# Patient Record
Sex: Male | Born: 1960 | Race: White | Hispanic: No | Marital: Married | Smoking: Current every day smoker
Health system: Southern US, Community
[De-identification: ages and names within clinical notes are randomized; demographics above are authoritative.]

---

## 2006-12-29 ENCOUNTER — Ambulatory Visit: Payer: Self-pay | Admitting: Vascular Surgery

## 2007-01-23 ENCOUNTER — Ambulatory Visit: Payer: Self-pay | Admitting: Vascular Surgery

## 2007-01-23 ENCOUNTER — Ambulatory Visit (HOSPITAL_COMMUNITY): Admission: RE | Admit: 2007-01-23 | Discharge: 2007-01-23 | Payer: Self-pay | Admitting: Vascular Surgery

## 2007-02-27 ENCOUNTER — Ambulatory Visit: Payer: Self-pay | Admitting: Vascular Surgery

## 2008-03-27 IMAGING — CR DG CHEST 2V
2 series · 2 of 2 positions shown · non-contrast
Comparison: none

CLINICAL DATA: Peripheral vascular disease and lower extremity claudication.  Preop respiratory exam.
 CHEST ? 2 VIEW:

[view not recorded (1 of 2)]
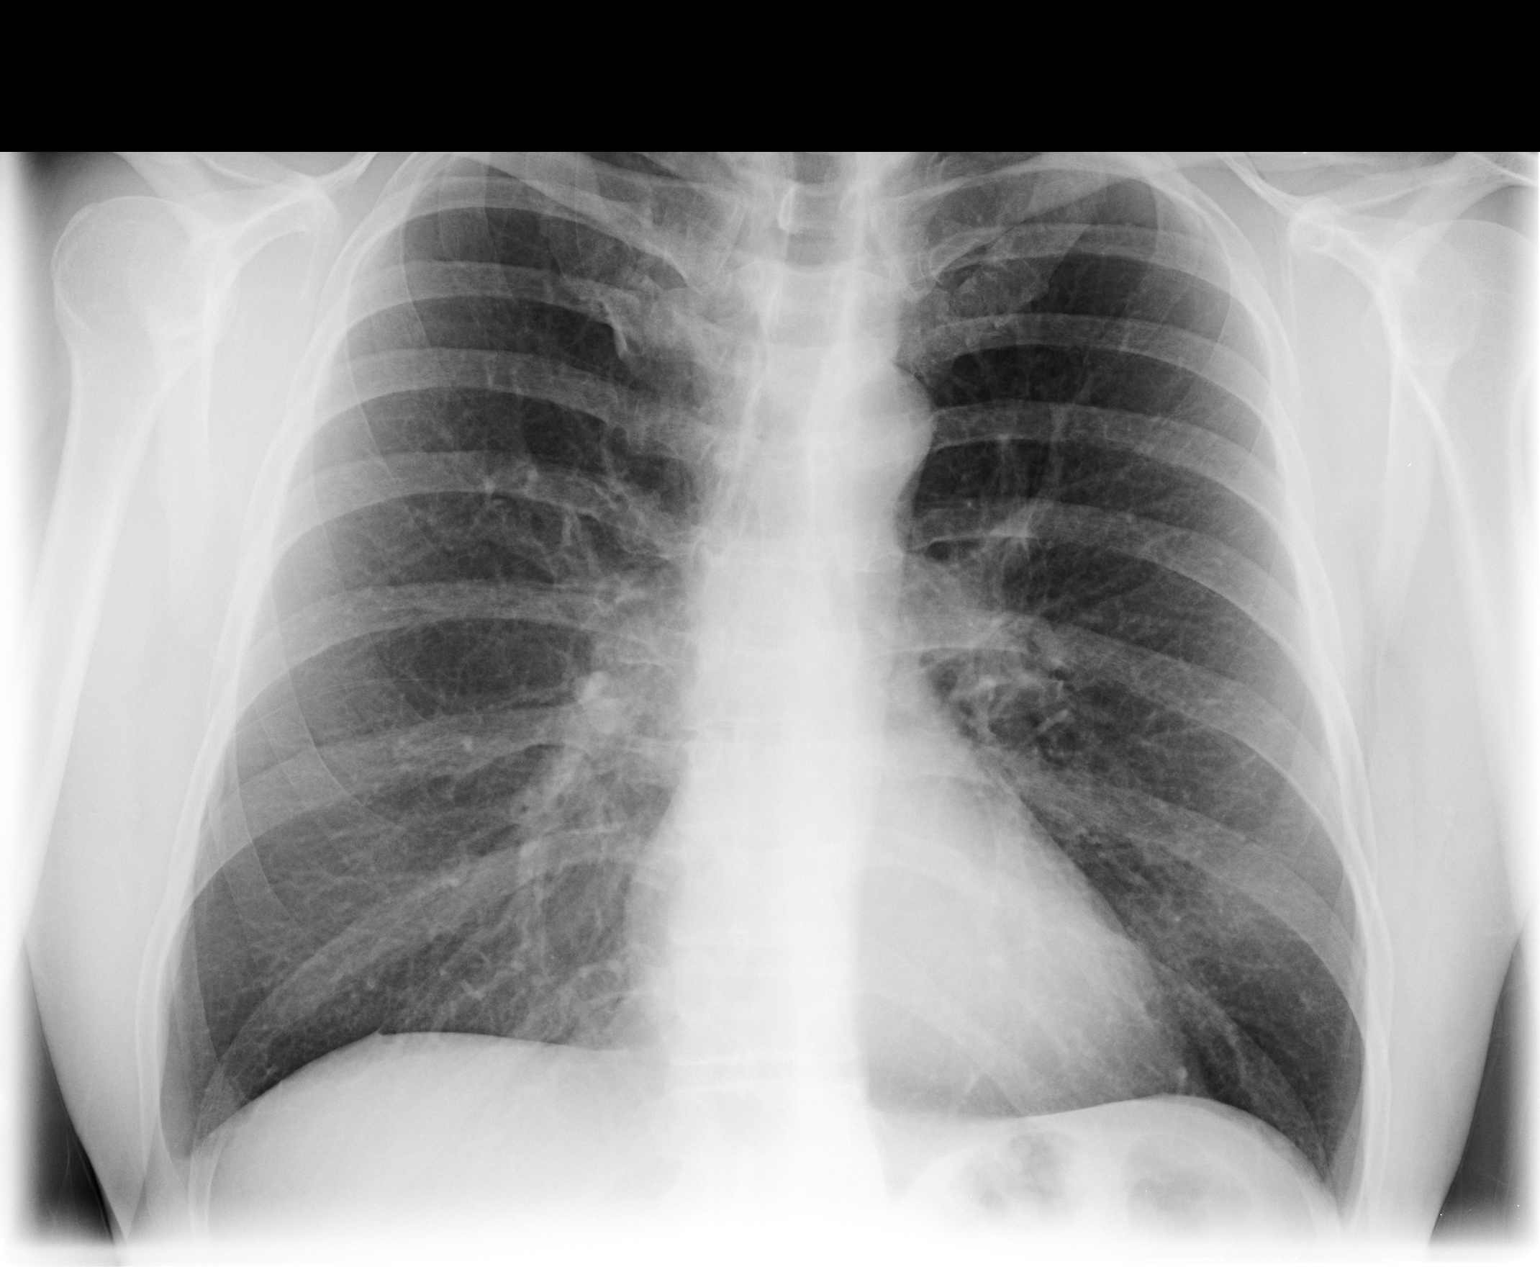

[view not recorded (2 of 2)]
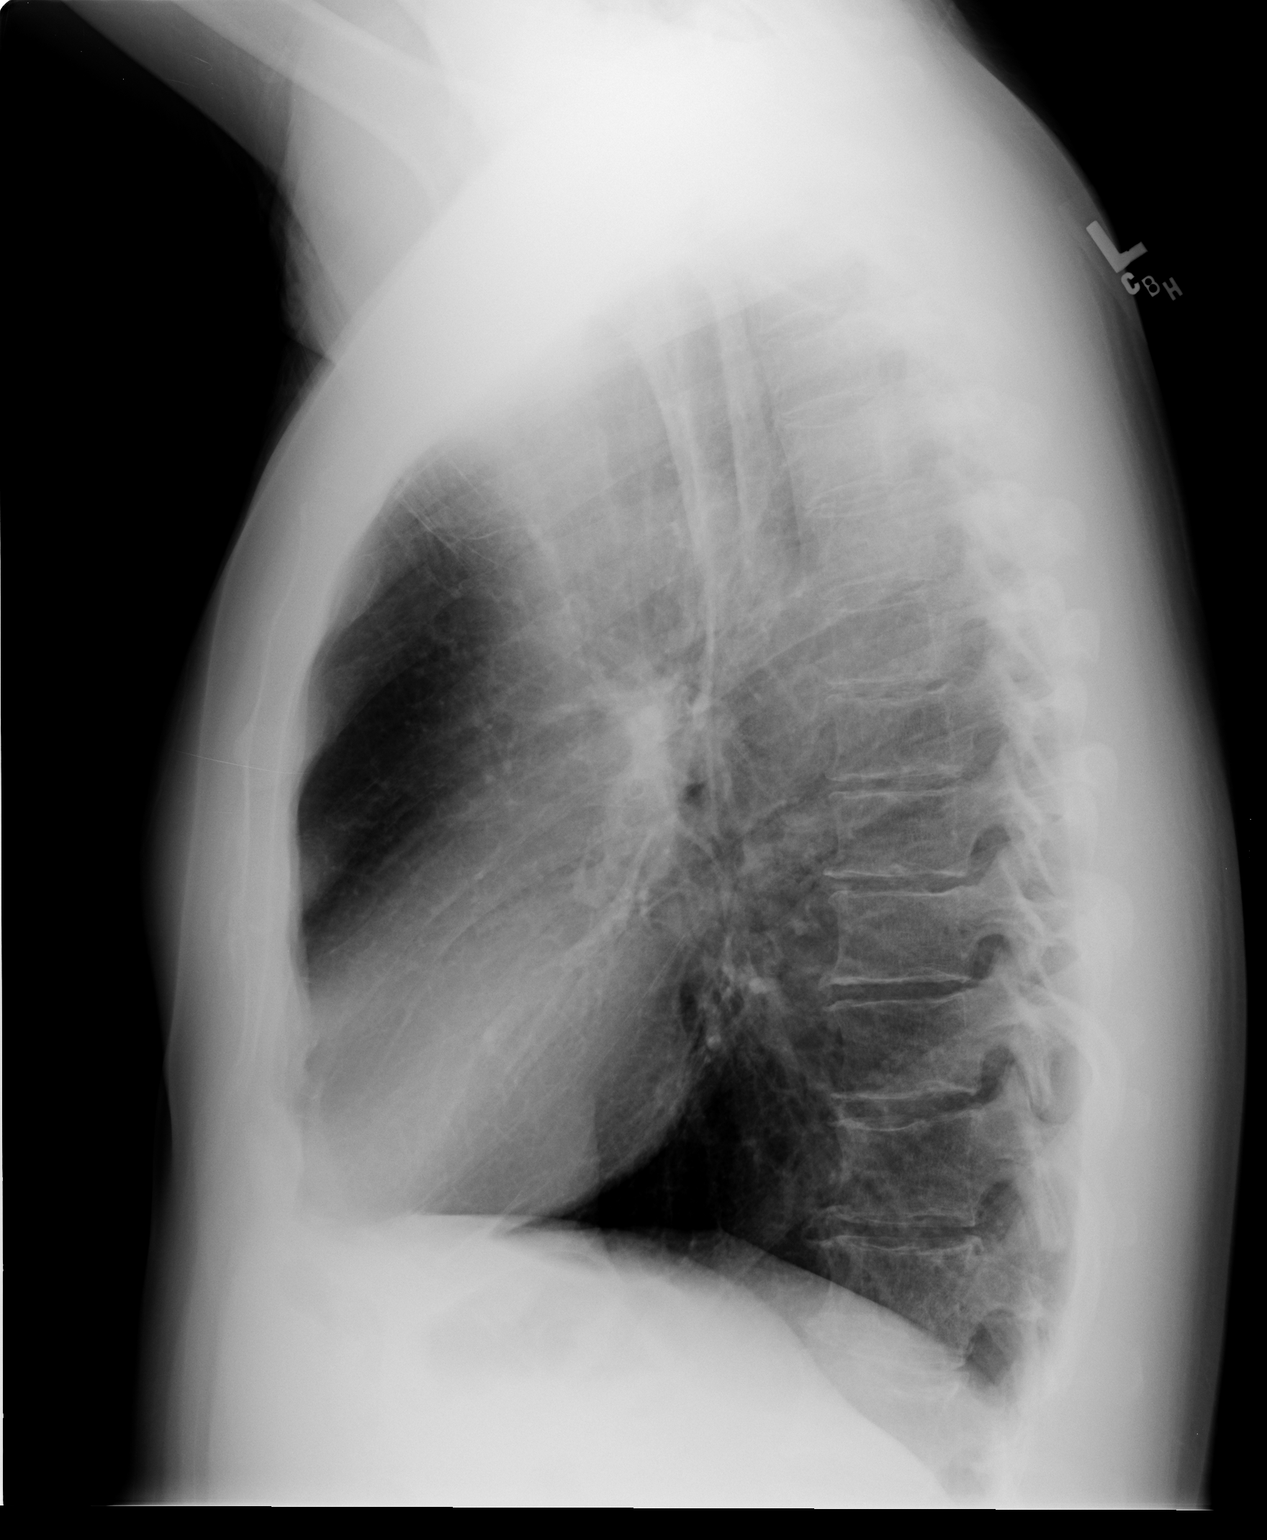

[2 of 2 positions shown; findings below may reference images not displayed]

FINDINGS: Heart size and mediastinal contours are normal.  Both lungs are clear.  Pulmonary hyperinflation is seen, consistent with COPD.  Incidental note is made of an azygous fissure, which is a normal variant.
IMPRESSION: COPD.  No active disease.

## 2010-11-27 NOTE — Assessment & Plan Note (Signed)
OFFICE VISIT   [name], [name]  DOB:  [birth year]                                       02/27/2007  VWUJW#:[id number]   REASON FOR VISIT:  [name] [name] comes in on this day for followup of his  aortogram with lower extremity runoff and also bilateral common iliac  artery angioplasty.  This was done on January 23, 2007.  He had limiting  claudication in both lower extremities.  He had some soreness around his  groin incisions after the angioplasty, but this resolved quickly.  He is  now swimming, walking and is having no claudication symptoms.   OBJECTIVE:  On exam, both groins are well-healed with no bruising, no  hematoma and no false aneurysm.  He has 2-3+ dorsalis pedis and  posterior tibial pulses bilaterally.  He underwent noninvasive vascular  laboratory studies in our office today and I have reviewed this with  him.  It shows completely normal wave forms and pressures bilaterally.   ASSESSMENT/PLAN:  I am quite pleased with [name] [name] initial result.  He  has discontinued the Plavix since he took this x30 days following the  procedure.  He continues a walking program and I again counseled him on  the importance for smoking cessation, activity and other risk factors.  He will continue to follow up with [place] and our vascular lab protocol.   [name] [name], M.D.  Electronically Signed   TFE/MEDQ  D:  02/27/2007  T:  03/02/2007  Job:  [id number]   cc:   [name] [name]

## 2010-11-27 NOTE — Consult Note (Signed)
NEW PATIENT CONSULTATION   Mccord, JEFFREY  DOB:  [birth year]                                       12/29/2006  [id number]#:[id number]   Drexler Maland presents today for evaluation of bilateral low extremity  claudication.  He reports that he has had increasingly severe bilateral  total leg claudication.  He reports that right is equal to left and has  been present for at least one year or greater.  This has been  progressive.  He reports that, if he swims, he can have calf  claudication as well.  He rests for a few minutes, this resolves, and he  does not have any rest pain.  He has no history of poor healing or  tissue loss.  He does have some erectile dysfunction, as well.   PAST MEDICAL HISTORY:  Otherwise unremarkable.  He does not have any  major medical difficulties.   SOCIAL HISTORY:  He is married with three children, does smoke 1-1/2  packs cigarettes per day and does not drink alcohol.   REVIEW OF SYSTEMS:  Only has bilateral  leg pain with walking.   MEDICATIONS:  He is on no medications.   ALLERGIES:  NO KNOWN DRUG ALLERGIES.   PHYSICAL EXAMINATION:  GENERAL:  Reveals a well-nourished white male.  His stated age is 46.  VITAL SIGNS:  Blood pressure 130/86, pulse 64, respirations 18.  NECK:  Carotid arteries are without bruits bilaterally.  EXTREMITIES:  He has 2+ radial pulses.  He has a faint left femoral  pulse.  I did not palpate any popliteal or distal pulses on the left. He  does have a 1+ dorsalis pedis pulse on the right.  He had undergone non-  invasive vascular laboratory studies at [facility].  We are  obtaining these from [facility].  There is currently no one  available to send these to [place].  I discussed this at length with Mr.  Caiazzo.  I explained that he apparently has aortoiliac occlusive  disease, from physical exam and history.  He stated this not limb  threatening.  I explained that in all likelihood he does have  lesions  and would be a high probability of being amenable to angioplasty.  I  explained that he could continue observation only, if he is tolerating  this level of ischemia.  Also explained that we may be able to crack  this with intervention or may require surgery for correction of his  arterial insufficiency.  He was relieved that the discussion regarding  non-limb threatening.  He will notify us, should he wish to proceed with  aortogram bilateral dorsi run-off and possible a left __________  angioplasty.  I had discussed the significance of his cigarette smoking  with him, explained the critical importance of smoking cessation, due to  his obvious premature atherosclerotic disease.   Larina Earthly, M.D.  Electronically Signed   TFE/MEDQ  D:  12/29/2006  T:  12/30/2006  Job:  [id number]   cc:   Selinda Flavin

## 2010-11-27 NOTE — Op Note (Signed)
NAME:  [name] [name], [name] [name]               ACCOUNT NO.:  [id number]   MEDICAL RECORD NO.:  [id number]          PATIENT TYPE:  AMB   LOCATION:  SDS                          FACILITY:  [facility]   PHYSICIAN:  Larina Earthly, M.D.    DATE OF BIRTH:  [birth year]   DATE OF PROCEDURE:  01/23/2007  DATE OF DISCHARGE:                               OPERATIVE REPORT   PREOPERATIVE DIAGNOSIS:  Aortoiliac occlusive disease.   POSTOPERATIVE DIAGNOSIS:  Aortoiliac occlusive disease.   PROCEDURE:  1. Aortogram with bilateral runoff.  2. Bilateral common iliac artery balloon angioplasty and OmniLink      stent placement.   SURGEON:  Larina Earthly, M.D.   ANESTHESIA:  Lidocaine 1% local.   COMPLICATIONS:  None.   DISPOSITION:  To holding area stable.   PROCEDURE IN DETAIL:  The patient was taken to the peripherovascular  cath lab, placed in the supine position where the area of both groins  were prepped and draped in the usual sterile fashion.  Using local  anesthesia and a single wall puncture, the right common femoral artery  was entered and the guide wire was passed up to the level of the  suprarenal aorta.  A 5-French sheath was passed over the guide wire and  a pigtail catheter was positioned at the level of the suprarenal aorta.  AP projection was undertaken revealing widely patent aorta and widely  patent renal arteries bilaterally.  The pigtail catheter was pulled  above the aortic bifurcation.  AP projection was undertaken.  This  revealed severe focal short segment common iliac artery stenoses  bilaterally.  There was retrograde filling of the left internal iliac  artery and antegrade filling of the right internal iliac artery.  Long  leg runoff was then obtained with step technique showing widely patent  profundus, superficial femoral and three-vessel runoff bilaterally.  I  discussed this with [name] [name] and recommended that we proceed with  angioplasty.  The left groin was then anesthetized  with 1% lidocaine  local and again using a single wall puncture, the left common femoral  artery was entered and a guide wire was passed up to the level of the  suprarenal aorta.  A long 6-French sheath was passed up to the level of  the aortic bifurcation.  The patient was given 5,000 units of  intravenous heparin.  The right short 5 sheath was exchanged for a long  6 French sheath as well into the level of the bifurcation.  Using hand  injections, the location of the focal stenosis in the right common iliac  artery was noted.  An 8 x 18 mm OmniLink stent was passed through the  sheath, the sheath was retracted and again using hand injections, the  position of the stenosis was identified.  The stent was deployed with  balloon inflation to 8 atmospheres with excellent result with  arteriography.  Next, and 8 x 18 mm length stent was chosen for the left  common iliac artery.  Again, this was positioned through the long sheath  and the long sheath was retracted.  Again using hand injection, the  location of the stenosis was noted and the stent was deployed with 8  atmosphere inflation of the balloon.  Finally a pigtail catheter was  positioned to  the level of the aortic bifurcation.  The completion of arteriogram  showed excellent positioning with no evidence of dissection with no  evidence of flow limiting stenosis.  The patient tolerated the procedure  with no immediate complication, was transferred to the holding area  where the sheaths were pulled after ACT  was normalized.      Larina Earthly, M.D.  Electronically Signed     TFE/MEDQ  D:  01/23/2007  T:  01/23/2007  Job:  [id number]

## 2011-04-30 LAB — COMPREHENSIVE METABOLIC PANEL
ALT: 17
AST: 17
Alkaline Phosphatase: 98
CO2: 26
Chloride: 104
GFR calc Af Amer: >60
GFR calc non Af Amer: >60
Potassium: 3.8
Sodium: 138
Total Bilirubin: 0.7

## 2011-04-30 LAB — CBC
MCV: 90.9
RBC: 5.36
WBC: 9.6

## 2016-02-29 ENCOUNTER — Other Ambulatory Visit: Payer: Self-pay | Admitting: *Deleted

## 2016-02-29 DIAGNOSIS — I739 Peripheral vascular disease, unspecified: Secondary | ICD-10-CM

## 2016-02-29 DIAGNOSIS — M79669 Pain in unspecified lower leg: Secondary | ICD-10-CM

## 2016-02-29 DIAGNOSIS — Z9862 Peripheral vascular angioplasty status: Secondary | ICD-10-CM

## 2016-03-01 ENCOUNTER — Ambulatory Visit (HOSPITAL_COMMUNITY)
Admission: RE | Admit: 2016-03-01 | Discharge: 2016-03-01 | Disposition: A | Discharge status: Home / Self Care | Payer: BC Managed Care – PPO | Source: Ambulatory Visit | Attending: Vascular Surgery | Admitting: Vascular Surgery

## 2016-03-01 DIAGNOSIS — I739 Peripheral vascular disease, unspecified: Secondary | ICD-10-CM

## 2016-03-01 DIAGNOSIS — Z9862 Peripheral vascular angioplasty status: Secondary | ICD-10-CM | POA: Diagnosis not present

## 2016-03-01 DIAGNOSIS — R0989 Other specified symptoms and signs involving the circulatory and respiratory systems: Secondary | ICD-10-CM | POA: Insufficient documentation

## 2016-03-01 DIAGNOSIS — M79669 Pain in unspecified lower leg: Secondary | ICD-10-CM | POA: Diagnosis not present

## 2016-03-04 ENCOUNTER — Encounter: Payer: Self-pay | Admitting: Vascular Surgery

## 2016-03-05 ENCOUNTER — Ambulatory Visit (HOSPITAL_COMMUNITY)
Admission: RE | Admit: 2016-03-05 | Discharge: 2016-03-05 | Disposition: A | Discharge status: Home / Self Care | Payer: BC Managed Care – PPO | Source: Ambulatory Visit | Attending: Vascular Surgery | Admitting: Vascular Surgery

## 2016-03-05 ENCOUNTER — Ambulatory Visit (INDEPENDENT_AMBULATORY_CARE_PROVIDER_SITE_OTHER): Payer: BC Managed Care – PPO | Admitting: Vascular Surgery

## 2016-03-05 ENCOUNTER — Encounter: Payer: Self-pay | Admitting: Vascular Surgery

## 2016-03-05 VITALS — BP 152/93 | HR 77 | Ht 72.0 in | Wt 222.8 lb

## 2016-03-05 DIAGNOSIS — I739 Peripheral vascular disease, unspecified: Secondary | ICD-10-CM

## 2016-03-05 NOTE — Progress Notes (Signed)
Patient name: [name] MRN: [id number] DOB: 1960/11/27 Sex: male  REASON FOR CONSULT: Evaluate right greater than left claudication  HPI: [name] [name] is a 55 y.o. male, and his knee from prior lateral common iliac artery stenting in July 2008. He had been extremely premature atherosclerotic disease and underwent arteriogram showing high-grade stenosis and underwent bilateral common iliac artery stenting. He is doing quite well since that time. He reports that recently he has been having a recurrence of symptoms more so on the right leg than on the left leg. He reports that this began approximately 1-1/2 years ago and has been progressive. He is quite active and reports this prevents him from hiking and doing his usual walking exercise program. On the right this is in his right thigh and into his calf. On the left this is mostly calf Only. Does prevent him from doing his enjoy walking but he is able to do his usual activities around the house. He does not have any rest pain.  No past medical history on file.  No family history on file.  SOCIAL HISTORY: Social History   Social History  . Marital status: Married    Spouse name: N/A  . Number of children: N/A  . Years of education: N/A   Occupational History  . Not on file.   Social History Main Topics  . Smoking status: Current Every Day Smoker    Packs/day: 1.00    Years: 34.00    Types: Cigarettes  . Smokeless tobacco: Never Used  . Alcohol use No  . Drug use: No  . Sexual activity: Not on file   Other Topics Concern  . Not on file   Social History Narrative  . No narrative on file    Not on File  Current Outpatient Prescriptions  Medication Sig Dispense Refill  . aspirin EC 81 MG tablet Take 81 mg by mouth daily.    . Multiple Vitamins-Minerals (MULTIVITAMIN WITH MINERALS) tablet Take 1 tablet by mouth daily.     No current facility-administered medications for this visit.     REVIEW OF SYSTEMS:  '[X]'$   denotes positive finding, '[ ]'$  denotes negative finding Cardiac  Comments:  Chest pain or chest pressure:    Shortness of breath upon exertion:    Short of breath when lying flat:    Irregular heart rhythm:        Vascular    Pain in calf, thigh, or hip brought on by ambulation: x   Pain in feet at night that wakes you up from your sleep:     Blood clot in your veins:    Leg swelling:         Pulmonary    Oxygen at home:    Productive cough:     Wheezing:         Neurologic    Sudden weakness in arms or legs:     Sudden numbness in arms or legs:     Sudden onset of difficulty speaking or slurred speech:    Temporary loss of vision in one eye:     Problems with dizziness:         Gastrointestinal    Blood in stool:     Vomited blood:         Genitourinary    Burning when urinating:     Blood in urine:        Psychiatric    Major depression:  Hematologic    Bleeding problems:    Problems with blood clotting too easily:        Skin    Rashes or ulcers:        Constitutional    Fever or chills:      PHYSICAL EXAM: Vitals:   03/05/16 0833 03/05/16 0839  BP: (!) 158/95 (!) 152/93  Pulse: 77   SpO2: 96%   Weight: 222 lb 12.8 oz (101.1 kg)   Height: 6' (1.829 m)     GENERAL: The patient is a well-nourished male, in no acute distress. The vital signs are documented above. CARDIAC: There is a regular rate and rhythm.  VASCULAR: Carotid arteries without bruits bilaterally. Radial pulses 2+ bilaterally. Femoral pulses 2+ bilaterally diminished dorsalis pedis pulses bilaterally PULMONARY: There is good air exchange bilaterally without wheezing or rales. ABDOMEN: Soft and non-tender with normal pitched bowel sounds. No bruit noted MUSCULOSKELETAL: There are no major deformities or cyanosis. NEUROLOGIC: No focal weakness or paresthesias are detected. SKIN: There are no ulcers or rashes noted. PSYCHIATRIC: The patient has a normal affect.  DATA:   He had  undergone noninvasive studies in our office last week. This revealed triphasic dorsalis pedis and posterior tibial waveforms bilaterally. Had a slight reduction in ankle arm index on the right of 0.90 normal on the left. Duplex imaging showed no specific level of stenosis.  He underwent exercise noninvasive studies are office today and this showed a dramatic drop in his right pressure immediately after walking with prolonged recovery. Left leg showed no evidence of drop in his exercise pressures  MEDICAL ISSUES:  Recurrent symptoms after 9 years status post iliac stenting. Explain that his noninvasive studies to clearly suggest the iliac occlusive disease on the right. I did not show any evidence of occlusive disease on the left. Explained that may not be possible to correct his left leg symptoms based on arterial pathology. He understands. His main issue is his right leg and reports that it would be acceptable if we can correct the right leg symptoms. We will schedule outpatient arteriography and possible intervention at his earliest convenience   Maggie Senseney [facility] [employer] [phone]

## 2016-03-11 ENCOUNTER — Other Ambulatory Visit: Payer: Self-pay

## 2016-03-25 ENCOUNTER — Ambulatory Visit (HOSPITAL_COMMUNITY)
Admission: RE | Admit: 2016-03-25 | Discharge: 2016-03-25 | Disposition: A | Discharge status: Home / Self Care | Payer: BC Managed Care – PPO | Source: Ambulatory Visit | Attending: Vascular Surgery | Admitting: Vascular Surgery

## 2016-03-25 ENCOUNTER — Other Ambulatory Visit: Payer: Self-pay | Admitting: *Deleted

## 2016-03-25 ENCOUNTER — Encounter (HOSPITAL_COMMUNITY)
Admission: RE | Disposition: A | Discharge status: Home / Self Care | Payer: Self-pay | Source: Ambulatory Visit | Attending: Vascular Surgery

## 2016-03-25 DIAGNOSIS — I739 Peripheral vascular disease, unspecified: Secondary | ICD-10-CM

## 2016-03-25 DIAGNOSIS — Z9862 Peripheral vascular angioplasty status: Secondary | ICD-10-CM

## 2016-03-25 DIAGNOSIS — I70211 Atherosclerosis of native arteries of extremities with intermittent claudication, right leg: Secondary | ICD-10-CM | POA: Insufficient documentation

## 2016-03-25 HISTORY — PX: PERIPHERAL VASCULAR CATHETERIZATION: SHX172C

## 2016-03-25 LAB — POCT I-STAT, CHEM 8
BUN: 11 mg/dL (ref 6–20)
CHLORIDE: 103 mmol/L (ref 101–111)
Calcium, Ion: 1.17 mmol/L (ref 1.15–1.40)
Creatinine, Ser: 0.6 mg/dL — ABNORMAL LOW (ref 0.61–1.24)
GLUCOSE: 89 mg/dL (ref 65–99)
HEMATOCRIT: 49 % (ref 39.0–52.0)
HEMOGLOBIN: 16.7 g/dL (ref 13.0–17.0)
POTASSIUM: 4.6 mmol/L (ref 3.5–5.1)
SODIUM: 142 mmol/L (ref 135–145)
TCO2: 30 mmol/L (ref 0–100)

## 2016-03-25 LAB — POCT ACTIVATED CLOTTING TIME
Activated Clotting Time: 202 seconds
Activated Clotting Time: 164 seconds

## 2016-03-25 SURGERY — ABDOMINAL AORTOGRAM W/LOWER EXTREMITY
Laterality: Right

## 2016-03-25 MED ORDER — IODIXANOL 320 MG/ML IV SOLN
INTRAVENOUS | Status: DC | PRN
Start: 2016-03-25 — End: 2016-03-25
  Administered 2016-03-25: 155 mL via INTRA_ARTERIAL

## 2016-03-25 MED ORDER — HEPARIN SODIUM (PORCINE) 1000 UNIT/ML IJ SOLN
INTRAMUSCULAR | Status: DC | PRN
  Administered 2016-03-25: 5000 [IU] via INTRAVENOUS

## 2016-03-25 MED ORDER — LIDOCAINE HCL (PF) 1 % IJ SOLN
INTRAMUSCULAR | Status: DC | PRN
  Administered 2016-03-25: 14 mL

## 2016-03-25 MED ORDER — OXYCODONE-ACETAMINOPHEN 5-325 MG PO TABS
ORAL_TABLET | ORAL | Status: AC
  Filled 2016-03-25: qty 2

## 2016-03-25 MED ORDER — LIDOCAINE HCL (PF) 1 % IJ SOLN
INTRAMUSCULAR | Status: AC
  Filled 2016-03-25: qty 30

## 2016-03-25 MED ORDER — SODIUM CHLORIDE 0.9 % IV SOLN
1.0000 mL/kg/h | INTRAVENOUS | Status: DC
Start: 2016-03-25 — End: 2016-03-25
  Administered 2016-03-25: 1 mL/kg/h via INTRAVENOUS

## 2016-03-25 MED ORDER — CLOPIDOGREL BISULFATE 75 MG PO TABS: 75.0000 mg | ORAL_TABLET | Freq: Every day | ORAL | 0 refills | Status: DC

## 2016-03-25 MED ORDER — OXYCODONE-ACETAMINOPHEN 5-325 MG PO TABS
1.0000 | ORAL_TABLET | ORAL | Status: DC | PRN
  Administered 2016-03-25: 2 via ORAL

## 2016-03-25 MED ORDER — HEPARIN (PORCINE) IN NACL 2-0.9 UNIT/ML-% IJ SOLN
INTRAMUSCULAR | Status: AC
  Filled 2016-03-25: qty 1000

## 2016-03-25 MED ORDER — SODIUM CHLORIDE 0.9 % IV SOLN
INTRAVENOUS | Status: DC
  Administered 2016-03-25: 11:00:00 via INTRAVENOUS

## 2016-03-25 MED ORDER — HEPARIN (PORCINE) IN NACL 2-0.9 UNIT/ML-% IJ SOLN
INTRAMUSCULAR | Status: DC | PRN
  Administered 2016-03-25: 1000 mL via INTRA_ARTERIAL

## 2016-03-25 MED ORDER — HEPARIN SODIUM (PORCINE) 1000 UNIT/ML IJ SOLN
INTRAMUSCULAR | Status: AC
  Filled 2016-03-25: qty 1

## 2016-03-25 SURGICAL SUPPLY — 11 items
CATH ANGIO 5F PIGTAIL 65CM (CATHETERS) ×2 IMPLANT
DEVICE CONTINUOUS FLUSH (MISCELLANEOUS) ×2 IMPLANT
KIT ENCORE 26 ADVANTAGE (KITS) ×2 IMPLANT
KIT PV (KITS) ×4 IMPLANT
SHEATH BRITE TIP 7FR 35CM (SHEATH) ×2 IMPLANT
SHEATH PINNACLE 5F 10CM (SHEATH) ×2 IMPLANT
STENT OMNILINK ELITE 7X19X80 (Permanent Stent) ×2 IMPLANT
SYR MEDRAD MARK V 150ML (SYRINGE) ×4 IMPLANT
TRANSDUCER W/STOPCOCK (MISCELLANEOUS) ×4 IMPLANT
TRAY PV CATH (CUSTOM PROCEDURE TRAY) ×4 IMPLANT
WIRE HITORQ VERSACORE ST 145CM (WIRE) ×2 IMPLANT

## 2016-03-25 NOTE — Discharge Instructions (Signed)

## 2016-03-25 NOTE — Op Note (Signed)
    OPERATIVE REPORT  DATE OF SURGERY: 03/25/2016  PATIENT: [name] [name], 55 y.o. male MRN: [id number]  DOB: 02-27-1961  PRE-OPERATIVE DIAGNOSIS: Limiting claudication right leg  POST-OPERATIVE DIAGNOSIS:  Same  PROCEDURE: Aortogram with bilateral lower extremity runoff, right common iliac artery angioplasty and stent  SURGEON:  Curt Jews, M.D.  ANESTHESIA:  1% lidocaine  PLAN OF CARE: Holding area stable following procedure   PATIENT DISPOSITION:  PACU - hemodynamically stable  PROCEDURE DETAILS: Patient's 55 year old gentleman is status post bilateral common iliac artery stenting by myself and 2008. Recently it had recurrent symptoms of the limiting claudication in his right leg. He does report some tightness in his calf with walking but this is tolerable and much less degree than his right leg. Noninvasive studies revealed near normal pressures at rest but a marked drop on the right with exercise. He is taken to the peripheral vascular cath lab this time for arterial gram and possible intervention  Using SonoSite ultrasound and local anesthesia after usual prepping and draping, the right common femoral artery was entered with an 18-gauge needle and the guidewire passed centrally. 5 French sheath was passed over the guidewire and a pigtail catheter was positioned the level suprarenal aorta. AP projection revealed widely patent single renal arteries bilaterally and widely widely patent aorta. Prior stents were visualized. The left iliac artery was widely patent with no evidence of stenosis in the stent itself. Oblique films were also using confirm this. On the right there was a moderate to severe stenosis below the prior stent in the native common femoral artery well above the internal iliac artery takeoff. Runoff was then obtained through the same pigtail catheter and this revealed widely patent external iliac femoral popliteal and three-vessel runoff bilaterally.  The incision was made  to intervene on the right iliac since this was causing severe limiting claudication. A 7 French sheath was exchanged for the 5 French sheath and was positioned through the prior stent. Patient was given 5000 units intravenous heparin. A 7 mm x 19 mm balloon expandable stent was positioned through the long sheath and the sheath was withdrawn. Injection around the sheath revealed the level of the stenosis. This was inflated to 11 atm with complete correction of the stenosis. The balloon was removed. Final hand injection showed excellent result with no evidence of residual stenosis. The placed the sheath was flushed with heparinized saline. The patient was transferred to the holding area and the sheath was removed after the ACT was normalized  Findings #1 stenosis in the lower edge of the prior placed stent from 2008 in the right common iliac artery  #2 successful treatment with 7 mm balloon and balloon expandable stent placement #3 otherwise widely patent infrainguinal vessels    Curt Jews, M.D. 03/25/2016 3:02 PM

## 2016-03-25 NOTE — Progress Notes (Addendum)
Site area: RFA Site Prior to Removal:  Level 0 Pressure Applied For:60mn Manual:yes    Patient Status During Pull:stable   Post Pull Site:  Level Post Pull Instructions Given: yes  Post Pull Pulses Present:palpable  Dressing Applied: tegaderm  Bedrest begins '@1455'$  till 1855  Comments:

## 2016-03-26 ENCOUNTER — Encounter (HOSPITAL_COMMUNITY): Payer: Self-pay | Admitting: Vascular Surgery

## 2016-04-01 ENCOUNTER — Telehealth: Payer: Self-pay

## 2016-04-01 ENCOUNTER — Telehealth: Payer: Self-pay | Admitting: Vascular Surgery

## 2016-04-01 DIAGNOSIS — Z959 Presence of cardiac and vascular implant and graft, unspecified: Secondary | ICD-10-CM

## 2016-04-01 DIAGNOSIS — I739 Peripheral vascular disease, unspecified: Secondary | ICD-10-CM

## 2016-04-01 DIAGNOSIS — Z48812 Encounter for surgical aftercare following surgery on the circulatory system: Secondary | ICD-10-CM

## 2016-04-01 NOTE — Telephone Encounter (Signed)
LVM on home # for ABI's 10/9 @ 3:00 and F/U with Early on 10/10 @ 10 am

## 2016-04-01 NOTE — Telephone Encounter (Signed)
-----   Message from Denman George, RN sent at 04/01/2016 10:53 AM EDT ----- Regarding: needs f/u appt. Contact: [phone] This pt. had an Aortogram 03/25/16 by Dr. Donnetta Hutching.  Is inquiring about his f/u appt. In 2-3 weeks.  Noted in a staff message by Dr. Donnetta Hutching, he is to f/u in 3 weeks with ABI's.  Please schedule a f/u visit with TFE and ABI's on either 9/26 or 10/10, since he is off 10/3.  Thanks.

## 2016-04-01 NOTE — Telephone Encounter (Signed)
Phone call from pt.  Stated he was given restrictions following his angiogram 9/11, but not given any time frame for the restrictions re: swimming, bathing, resuming physical activity and sexual activity, and returning to work.  Stated the instructions only advised no driving for a 24 hr. time frame.  Reported that he has been working, as he works from home and works at [employer].  Denied any swelling or redness at the catheter insertion site, right groin.  Stated the insertion site has healed.   Reported he is not taking any pain medication.  Stated the right groin still has some soreness when he goes from a sitting to a standing position.  Pt. Also questions resuming the use of Cialis and Viagra.  Stated he uses these medications on special occasions, and questioned if safe to take them.  Advised the main concern would be if he is taking any Nitrates, as the use of Viagra is contraindicated in combination of Nitrates.  Stated he is on very little medication.  Reported he takes ASA, and now Plavix, besides taking a MVI.  Advised there is no noted drug to drug interaction with Plavix and Cialis or Viagra.  Advised that he should consult a pharmacist re: this question.  Gave pt. Okay to resume his usual physical activity, sexual activity, and as long has the right groin is healed up, he may swim/ bathe.  Verb. Understanding.  Ques. About f/u appt.  Advised a scheduler will contact him with appt.

## 2016-04-19 ENCOUNTER — Encounter: Payer: Self-pay | Admitting: Vascular Surgery

## 2016-04-22 ENCOUNTER — Ambulatory Visit (HOSPITAL_COMMUNITY)
Admission: RE | Admit: 2016-04-22 | Discharge: 2016-04-22 | Disposition: A | Discharge status: Home / Self Care | Payer: BC Managed Care – PPO | Source: Ambulatory Visit | Attending: Vascular Surgery | Admitting: Vascular Surgery

## 2016-04-22 DIAGNOSIS — Z9862 Peripheral vascular angioplasty status: Secondary | ICD-10-CM

## 2016-04-22 DIAGNOSIS — I739 Peripheral vascular disease, unspecified: Secondary | ICD-10-CM | POA: Insufficient documentation

## 2016-04-23 ENCOUNTER — Ambulatory Visit (INDEPENDENT_AMBULATORY_CARE_PROVIDER_SITE_OTHER): Payer: BC Managed Care – PPO | Admitting: Vascular Surgery

## 2016-04-23 ENCOUNTER — Encounter: Payer: Self-pay | Admitting: Vascular Surgery

## 2016-04-23 VITALS — BP 163/100 | HR 92 | Temp 99.0°F | Resp 20 | Ht 72.0 in | Wt 224.0 lb

## 2016-04-23 DIAGNOSIS — I739 Peripheral vascular disease, unspecified: Secondary | ICD-10-CM

## 2016-04-23 NOTE — Progress Notes (Signed)
[facility]  Patient name: [name] [name] MRN: [id number] DOB: 01-17-1961 Sex: male  REASON FOR VISIT: Follow-up recent 8 common iliac artery angioplasty and stenting on 03/25/2016  HPI: [name] [name] is a 55 y.o. male for follow-up. He is status post bilateral iliac stenting in 2008. He had recurrence of symptoms on the right and underwent outpatient procedure on 03/25/2016. He had had progression of disease in his native common iliac artery just below the old stent. He had uneventful angioplasty and stenting of this and was discharged home. A solution of his claudication symptoms.  No past medical history on file.  No family history on file.  SOCIAL HISTORY: Social History  Substance Use Topics  . Smoking status: Current Every Day Smoker    Packs/day: 1.00    Years: 34.00    Types: Cigarettes  . Smokeless tobacco: Never Used  . Alcohol use No    Not on File  Current Outpatient Prescriptions  Medication Sig Dispense Refill  . aspirin EC 81 MG tablet Take 81 mg by mouth daily.    . clopidogrel (PLAVIX) 75 MG tablet Take 1 tablet (75 mg total) by mouth daily. 30 tablet 0  . Multiple Vitamins-Minerals (MULTIVITAMIN WITH MINERALS) tablet Take 1 tablet by mouth daily.     No current facility-administered medications for this visit.     REVIEW OF SYSTEMS:  '[X]'$  denotes positive finding, '[ ]'$  denotes negative finding Cardiac  Comments:  Chest pain or chest pressure:    Shortness of breath upon exertion:    Short of breath when lying flat:    Irregular heart rhythm:        Vascular    Pain in calf, thigh, or hip brought on by ambulation:    Pain in feet at night that wakes you up from your sleep:     Blood clot in your veins:    Leg swelling:         Pulmonary    Oxygen at home:    Productive cough:     Wheezing:         Neurologic    Sudden weakness in arms or legs:     Sudden numbness in arms or legs:      Sudden onset of difficulty speaking or slurred speech:    Temporary loss of vision in one eye:     Problems with dizziness:         Gastrointestinal    Blood in stool:     Vomited blood:         Genitourinary    Burning when urinating:     Blood in urine:        Psychiatric    Major depression:         Hematologic    Bleeding problems:    Problems with blood clotting too easily:        Skin    Rashes or ulcers:        Constitutional    Fever or chills:      PHYSICAL EXAM: Vitals:   04/23/16 1021 04/23/16 1023  BP: (!) 160/101 (!) 163/100  Pulse: 92   Resp: 20   Temp: 99 F (37.2 C)   TempSrc: Oral   SpO2: 94%   Weight: 224 lb (101.6 kg)   Height: 6' (1.829 m)     GENERAL: The patient is a well-nourished male, in no acute distress. The vital signs are documented above. Palpable dorsalis  pedis pulses bilaterally. Right groin puncture site without any evidence of false aneurysm or hematoma.   DATA:  Triphasic posterior tibial and dorsalis pedis waveform on the right with normal ankle arm index and normal ankle arm index on the left as well  MEDICAL ISSUES: Stable status post iliacs angioplasty and stenting. We will continue his usual activity. We'll notify should he develop any new difficulties or claudication symptoms in the future    Rosetta Posner, MD FACS [facility] Tel [phone] Pager [phone]

## 2019-03-02 ENCOUNTER — Ambulatory Visit (INDEPENDENT_AMBULATORY_CARE_PROVIDER_SITE_OTHER): Payer: BC Managed Care – PPO

## 2019-03-02 ENCOUNTER — Other Ambulatory Visit: Payer: Self-pay

## 2019-03-02 ENCOUNTER — Ambulatory Visit
Admission: EM | Admit: 2019-03-02 | Discharge: 2019-03-02 | Disposition: A | Discharge status: Home / Self Care | Payer: BC Managed Care – PPO | Attending: Emergency Medicine | Admitting: Emergency Medicine

## 2019-03-02 DIAGNOSIS — R030 Elevated blood-pressure reading, without diagnosis of hypertension: Secondary | ICD-10-CM

## 2019-03-02 DIAGNOSIS — J209 Acute bronchitis, unspecified: Secondary | ICD-10-CM

## 2019-03-02 DIAGNOSIS — R05 Cough: Secondary | ICD-10-CM

## 2019-03-02 DIAGNOSIS — R9389 Abnormal findings on diagnostic imaging of other specified body structures: Secondary | ICD-10-CM

## 2019-03-02 MED ORDER — AZITHROMYCIN 250 MG PO TABS: 250.0000 mg | ORAL_TABLET | Freq: Every day | ORAL | 0 refills | Status: AC

## 2019-03-02 MED ORDER — PREDNISONE 20 MG PO TABS: 20.0000 mg | ORAL_TABLET | Freq: Two times a day (BID) | ORAL | 0 refills | Status: AC

## 2019-03-02 NOTE — ED Triage Notes (Signed)
Pt states he has had chronic cough since May , pt states he is smoker. States cough is non productive, no covid exposure

## 2019-03-02 NOTE — ED Provider Notes (Signed)
[facility]   [id number] 03/02/19 Arrival Time: 93  CC: COUGH  SUBJECTIVE:  [name] [name] is a 58 y.o. male who presents with chronic cough x 3 months.  Worsening over the past few weeks.  Denies sick exposure to COVID, flu or strep.  Denies recent travel.  Admits to tobacco use, 1 PPD x 40 years.  Describes cough as intermittent and nonproductive.  Has tried OTC medications without relief.  Denies previous symptoms in the past.   Denies fever, chills, fatigue, sinus pain, rhinorrhea, sore throat, SOB, wheezing, chest pain, nausea, changes in bowel or bladder habits.    ROS: As per HPI.  All other pertinent ROS negative.      History reviewed. No pertinent past medical history. Past Surgical History:  Procedure Laterality Date  . PERIPHERAL VASCULAR CATHETERIZATION N/A 03/25/2016   Procedure: Abdominal Aortogram w/Lower Extremity;  Surgeon: Rosetta Posner, MD;  Location: [facility];  Service: Cardiovascular;  Laterality: N/A;  . PERIPHERAL VASCULAR CATHETERIZATION Right 03/25/2016   Procedure: Peripheral Vascular Intervention;  Surgeon: Rosetta Posner, MD;  Location: [facility];  Service: Cardiovascular;  Laterality: Right;  external illiac   No Known Allergies No current facility-administered medications on file prior to encounter.    Current Outpatient Medications on File Prior to Encounter  Medication Sig Dispense Refill  . aspirin EC 81 MG tablet Take 81 mg by mouth daily.    . Multiple Vitamins-Minerals (MULTIVITAMIN WITH MINERALS) tablet Take 1 tablet by mouth daily.      Social History   Socioeconomic History  . Marital status: Married    Spouse name: Not on file  . Number of children: Not on file  . Years of education: Not on file  . Highest education level: Not on file  Occupational History  . Not on file  Social Needs  . Financial resource strain: Not on file  . Food insecurity    Worry: Not on file    Inability: Not on file  .  Transportation needs    Medical: Not on file    Non-medical: Not on file  Tobacco Use  . Smoking status: Current Every Day Smoker    Packs/day: 1.00    Years: 34.00    Pack years: 34.00    Types: Cigarettes  . Smokeless tobacco: Never Used  Substance and Sexual Activity  . Alcohol use: No  . Drug use: No  . Sexual activity: Not on file  Lifestyle  . Physical activity    Days per week: Not on file    Minutes per session: Not on file  . Stress: Not on file  Relationships  . Social Herbalist on phone: Not on file    Gets together: Not on file    Attends religious service: Not on file    Active member of club or organization: Not on file    Attends meetings of clubs or organizations: Not on file    Relationship status: Not on file  . Intimate partner violence    Fear of current or ex partner: Not on file    Emotionally abused: Not on file    Physically abused: Not on file    Forced sexual activity: Not on file  Other Topics Concern  . Not on file  Social History Narrative  . Not on file   History reviewed. No pertinent family history.   OBJECTIVE:  Vitals:   03/02/19 1014  BP: (!) 168/113  Pulse: 92  Resp: 20  Temp: 99.3 F (37.4 C)  SpO2: 94%     General appearance: Alert, appears mildly fatigued, but nontoxic; speaking in full sentences without difficulty HEENT:NCAT; Ears: EACs clear, TMs pearly gray; Eyes: PERRL.  EOM grossly intact. Nose: nares patent without rhinorrhea; Throat: tonsils nonerythematous or enlarged, uvula midline  Neck: supple without LAD Lungs: fine crackles heard over LT anterior chest; mild cough present Heart: regular rate and rhythm.  Skin: warm and dry Psychological: alert and cooperative; normal mood and affect  DIAGNOSTIC STUDIES:  Dg Chest 2 View  Result Date: 03/02/2019 CLINICAL DATA:  Cough for 3 months EXAM: CHEST - 2 VIEW COMPARISON:  None FINDINGS: Cardiac shadow is within normal limits. The lungs are  hyperinflated without focal infiltrate or sizable effusion. There is fullness in the region of the aortic or pulmonary window on the left. This is suspicious for underlying adenopathy. Degenerative changes of the thoracic spine are noted. IMPRESSION: Fullness in the AP window on the left suspicious for underlying adenopathy. CT of the chest with contrast material is recommended for further evaluation. These results will be called to the ordering clinician or representative by the Radiologist Assistant, and communication documented in the PACS or zVision Dashboard. Electronically Signed   By: Inez Catalina M.D.   On: 03/02/2019 10:55    X-rays negative for obvious infiltrate or pleural effusion.      I have reviewed the x-rays myself and the radiologist interpretation. I am in agreement with the radiologist interpretation.     ASSESSMENT & PLAN:  1. Acute bronchitis, unspecified organism   2. Elevated blood pressure reading   3. Abnormal chest x-ray     Meds ordered this encounter  Medications  . predniSONE (DELTASONE) 20 MG tablet    Sig: Take 1 tablet (20 mg total) by mouth 2 (two) times daily with a meal for 5 days.    Dispense:  10 tablet    Refill:  0    Order Specific Question:   Supervising Provider    Answer:   Raylene Everts [[id number]]  . azithromycin (ZITHROMAX) 250 MG tablet    Sig: Take 1 tablet (250 mg total) by mouth daily. Take first 2 tablets together, then 1 every day until finished.    Dispense:  6 tablet    Refill:  0    Order Specific Question:   Supervising Provider    Answer:   Raylene Everts [[id number]]    Orders Placed This Encounter  Procedures  . DG Chest 2 View    Standing Status:   Standing    Number of Occurrences:   1    Order Specific Question:   Reason for Exam (SYMPTOM  OR DIAGNOSIS REQUIRED)    Answer:   chronic cough    Offered patient further evaluation in the ED for CT scan.  Patient declines at this time. Patient reports he does not have  the money.  States he has no insurance.  Recently divorced.  Daughter is getting married in 3 weeks.    Chest x-ray suspicious for adenopathy.  Radiologist recommends CT scan.  Patient will follow up with PCP in the near future for further evaluation and management  Get plenty of rest and push fluids Prednisone prescribed.  Take as directed and to completion Azithromycin prescribed.  Take as directed and to completion Follow up with PCP for recheck and/or if symptoms persists Return or go to ER if you have any new  or worsening symptoms such as fever, chills, fatigue, shortness of breath, wheezing, chest pain, nausea, changes in bowel or bladder habits, etc...   Blood pressure elevated in office.  Please recheck in 24 hours.  If it continues to be greater than 140/90 please follow up with PCP for further evaluation and management.    Reviewed expectations re: course of current medical issues. Questions answered. Outlined signs and symptoms indicating need for more acute intervention. Patient verbalized understanding. After Visit Summary given.          Lestine Box, PA-C 03/02/19 1123

## 2019-03-02 NOTE — Discharge Instructions (Addendum)
Chest x-ray suspicious for adenopathy.  Radiologist recommends CT scan.  Patient will follow up with PCP in the near future for further evaluation and management  Get plenty of rest and push fluids Prednisone prescribed.  Take as directed and to completion Azithromycin prescribed.  Take as directed and to completion Follow up with PCP for recheck and/or if symptoms persists Return or go to ER if you have any new or worsening symptoms such as fever, chills, fatigue, shortness of breath, wheezing, chest pain, nausea, changes in bowel or bladder habits, etc...   Blood pressure elevated in office.  Please recheck in 24 hours.  If it continues to be greater than 140/90 please follow up with PCP for further evaluation and management.

## 2020-05-06 IMAGING — DX CHEST - 2 VIEW
2 series · 2 of 2 positions shown · non-contrast
Comparison: None

CLINICAL DATA: Cough for 3 months

EXAM:
CHEST - 2 VIEW

[chest pa]
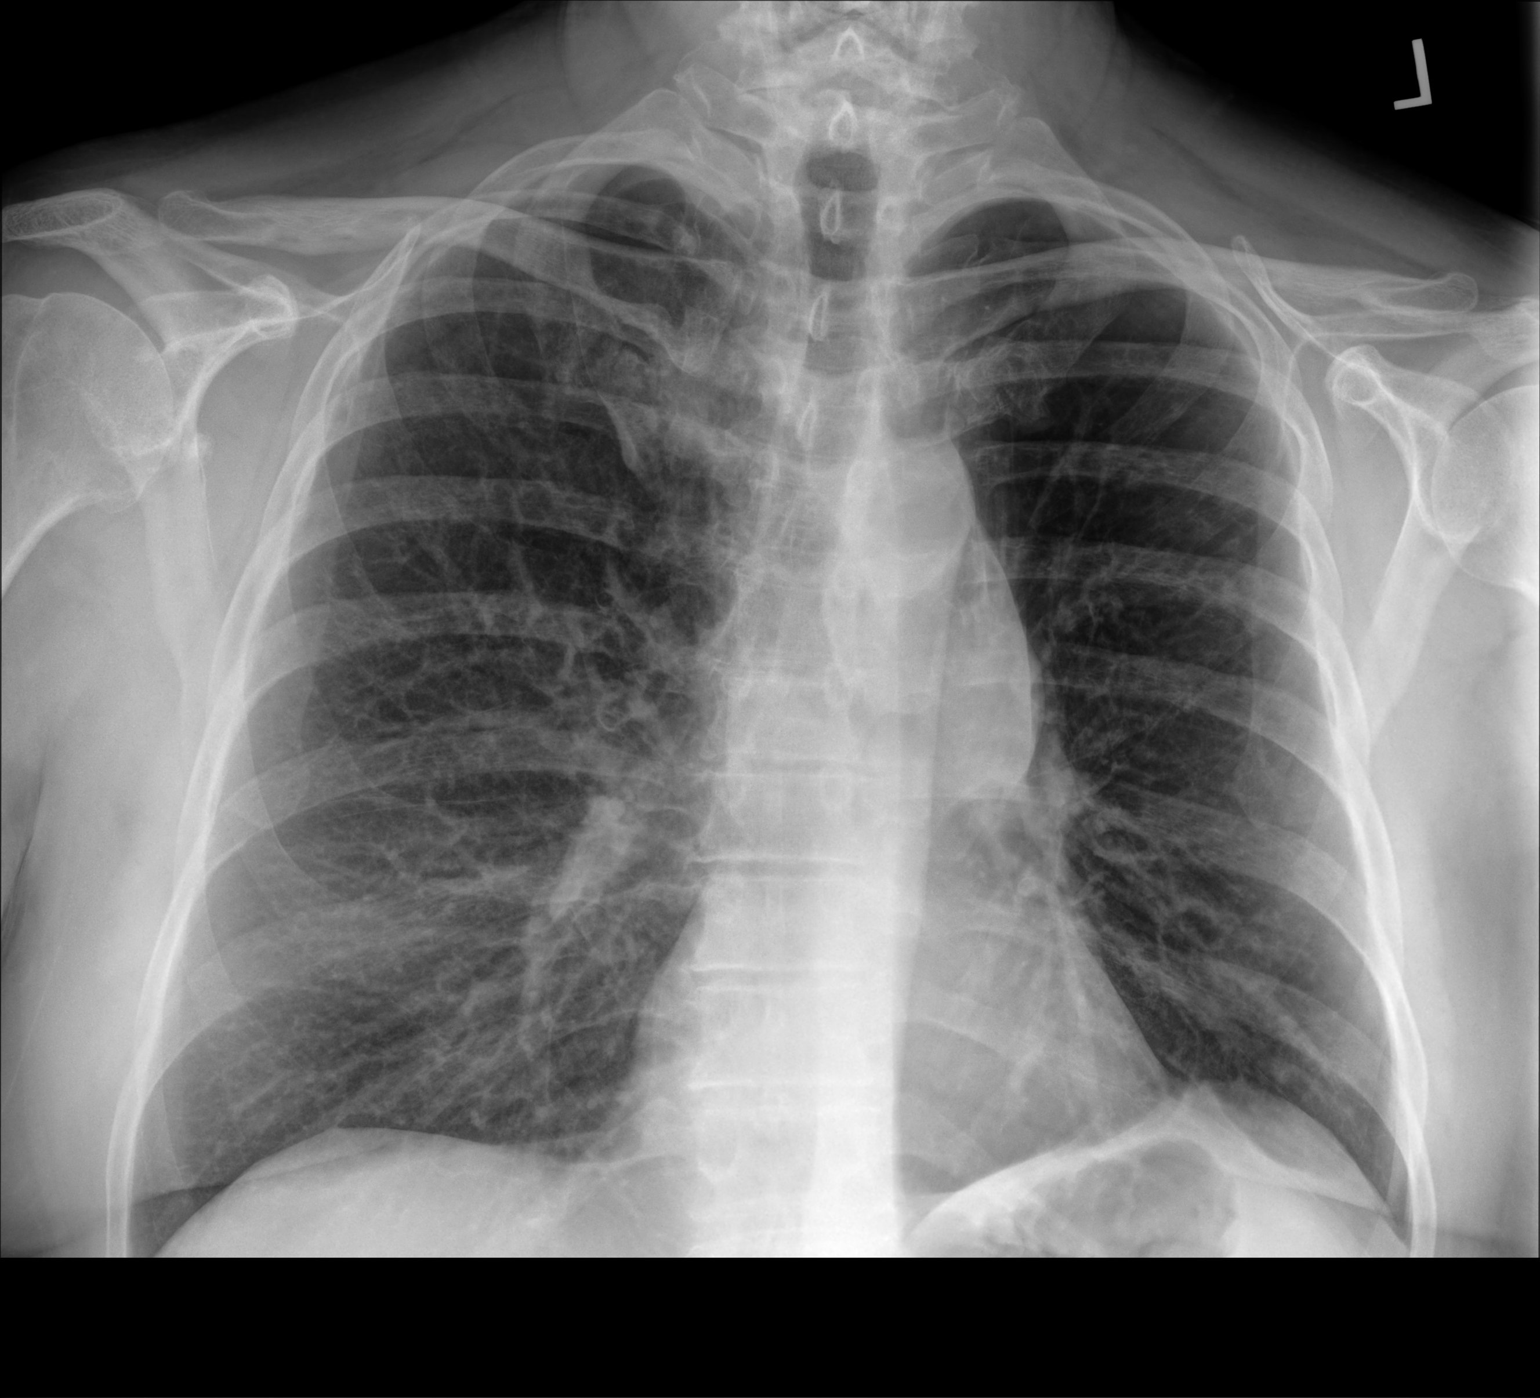

[chest lat]
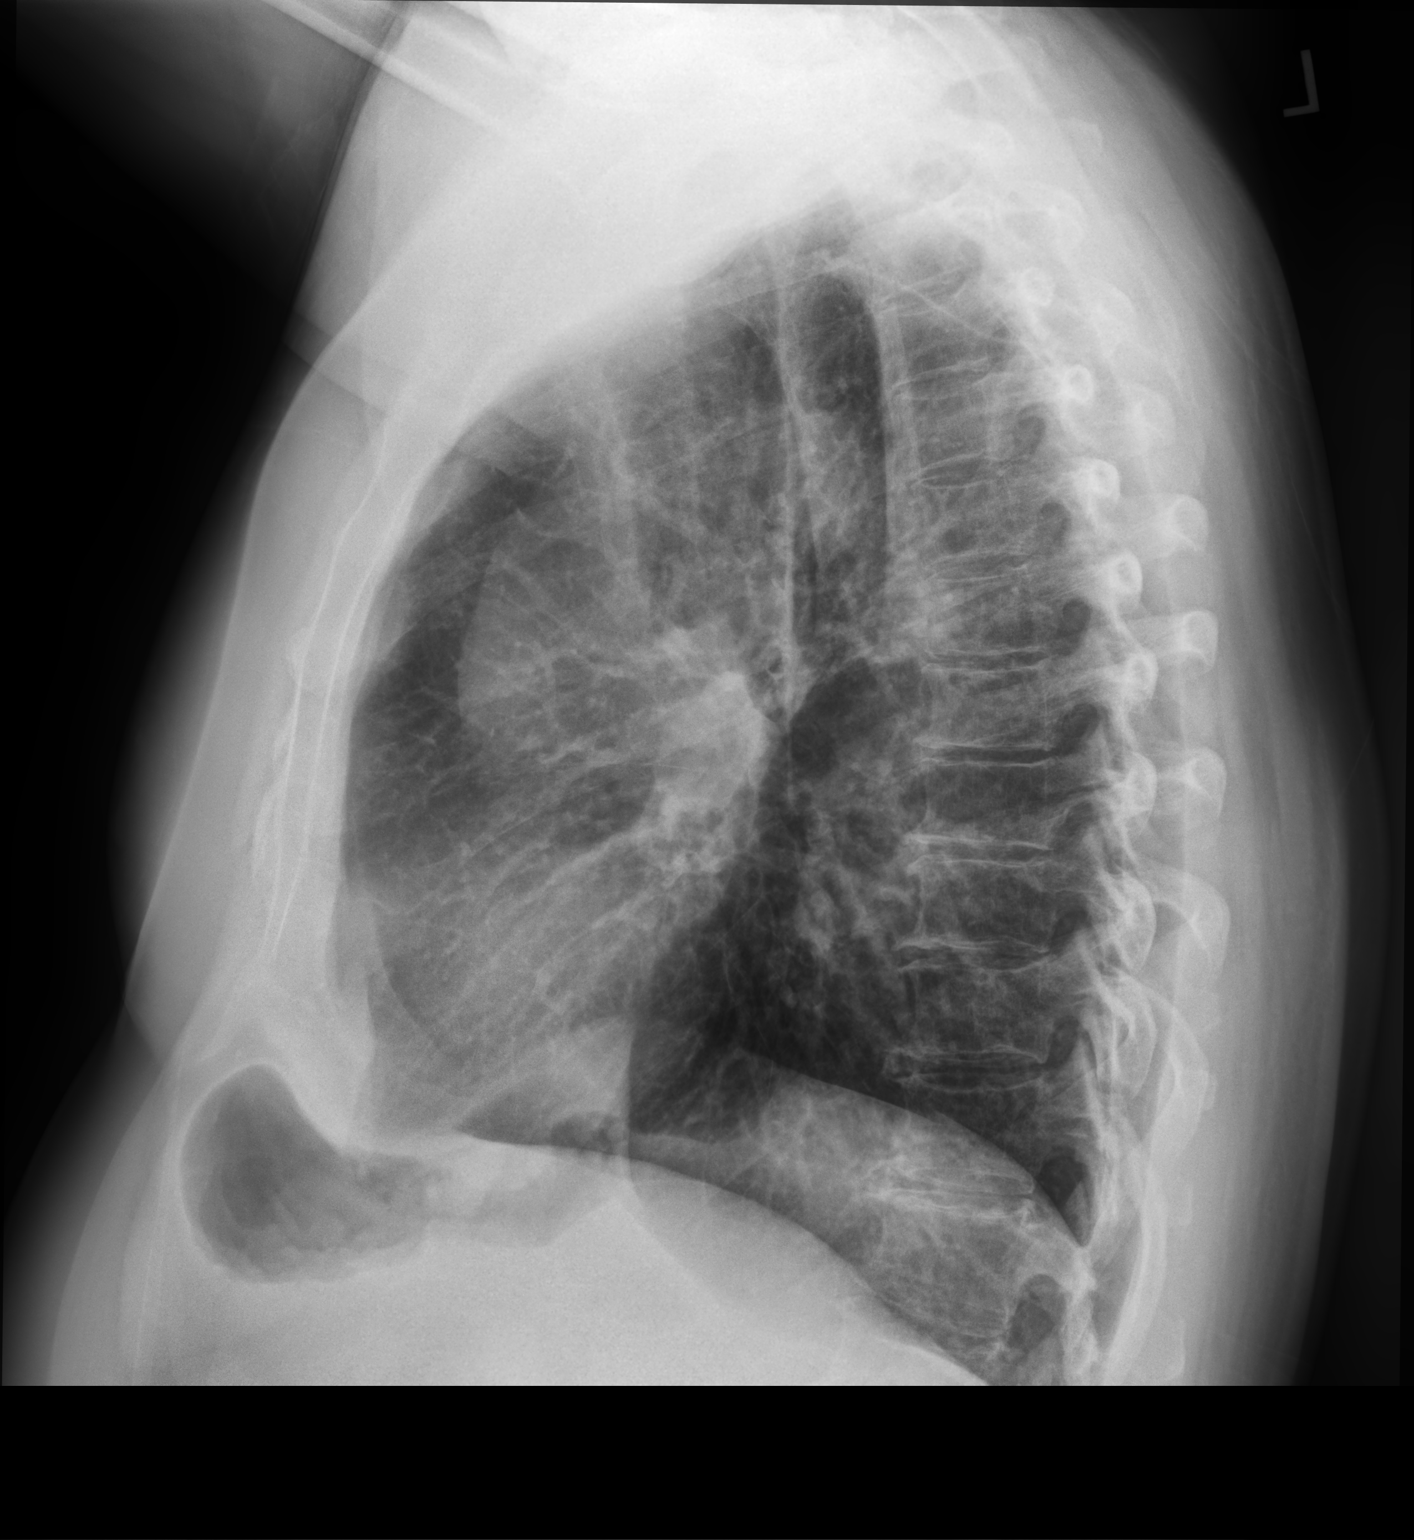

[2 of 2 positions shown; findings below may reference images not displayed]

FINDINGS: Cardiac shadow is within normal limits. The lungs are hyperinflated
without focal infiltrate or sizable effusion. There is fullness in
the region of the aortic or pulmonary window on the left. This is
suspicious for underlying adenopathy. Degenerative changes of the
thoracic spine are noted.
IMPRESSION: Fullness in the AP window on the left suspicious for underlying
adenopathy. CT of the chest with contrast material is recommended
for further evaluation.

These results will be called to the ordering clinician or
representative by the Radiologist Assistant, and communication
documented in the PACS or zVision Dashboard.

## 2021-03-28 ENCOUNTER — Other Ambulatory Visit (HOSPITAL_COMMUNITY): Payer: Self-pay | Admitting: Radiation Oncology

## 2021-03-28 DIAGNOSIS — E278 Other specified disorders of adrenal gland: Secondary | ICD-10-CM

## 2021-03-28 DIAGNOSIS — Z859 Personal history of malignant neoplasm, unspecified: Secondary | ICD-10-CM

## 2021-03-28 DIAGNOSIS — C3402 Malignant neoplasm of left main bronchus: Secondary | ICD-10-CM

## 2021-04-05 ENCOUNTER — Other Ambulatory Visit (HOSPITAL_COMMUNITY): Payer: Self-pay

## 2022-02-12 DEATH — deceased
